# Patient Record
Sex: Female | Born: 1970 | Race: Black or African American | Hispanic: No | Marital: Married | Smoking: Never smoker
Health system: Southern US, Community
[De-identification: ages and names within clinical notes are randomized; demographics above are authoritative.]

## PROBLEM LIST (undated history)

## (undated) DIAGNOSIS — R87610 Atypical squamous cells of undetermined significance on cytologic smear of cervix (ASC-US): Secondary | ICD-10-CM

## (undated) HISTORY — DX: Atypical squamous cells of undetermined significance on cytologic smear of cervix (ASC-US): R87.610

---

## 2016-09-12 DIAGNOSIS — R87610 Atypical squamous cells of undetermined significance on cytologic smear of cervix (ASC-US): Secondary | ICD-10-CM

## 2016-09-12 HISTORY — DX: Atypical squamous cells of undetermined significance on cytologic smear of cervix (ASC-US): R87.610

## 2016-09-26 ENCOUNTER — Ambulatory Visit (INDEPENDENT_AMBULATORY_CARE_PROVIDER_SITE_OTHER): Payer: 59 | Admitting: Gynecology

## 2016-09-26 ENCOUNTER — Encounter: Payer: Self-pay | Admitting: Gynecology

## 2016-09-26 ENCOUNTER — Other Ambulatory Visit: Payer: Self-pay | Admitting: Gynecology

## 2016-09-26 VITALS — BP 122/78 | Ht 66.0 in | Wt 130.0 lb

## 2016-09-26 DIAGNOSIS — N926 Irregular menstruation, unspecified: Secondary | ICD-10-CM

## 2016-09-26 DIAGNOSIS — Z1231 Encounter for screening mammogram for malignant neoplasm of breast: Secondary | ICD-10-CM

## 2016-09-26 DIAGNOSIS — N852 Hypertrophy of uterus: Secondary | ICD-10-CM

## 2016-09-26 DIAGNOSIS — Z01419 Encounter for gynecological examination (general) (routine) without abnormal findings: Secondary | ICD-10-CM

## 2016-09-26 LAB — TSH: TSH: 1.78 m[IU]/L

## 2016-09-26 MED ORDER — MEGESTROL ACETATE 20 MG PO TABS: 20.0000 mg | ORAL_TABLET | Freq: Two times a day (BID) | ORAL | 1 refills | Status: DC

## 2016-09-26 NOTE — Progress Notes (Addendum)
Kaitlyn Escobar Aug 28, 1970 [id number]        46 y.o.  G0P0000 new patient for annual exam who presents to establish care .  Patient also complaining of a history of regular monthly menses until June where she bled straight for 30+ days stopped for 16 or so days and then started bleeding again now. Not having cramps or significant discomfort. No history of same before. Never told she had GYN issues in the past such as leiomyoma. Not sexually active for over a year. No significant weight changes skin or hair changes. No hot flushes sweats. No nipple discharge.   Past medical history,surgical history, problem list, medications, allergies, family history and social history were all reviewed and documented as reviewed in the EPIC chart.  ROS:  Performed with pertinent positives and negatives included in the history, assessment and plan.   Additional significant findings :  None   Exam: Caryn Bee assistant Vitals:   09/26/16 1413  BP: 122/78  Weight: 130 lb (59 kg)  Height: 5\' 6"  (1.676 m)   Body mass index is 20.98 kg/m.  General appearance:  Normal affect, orientation and appearance. Skin: Grossly normal HEENT: Without gross lesions.  No cervical or supraclavicular adenopathy. Thyroid normal.  Lungs:  Clear without wheezing, rales or rhonchi Cardiac: RR, without RMG Abdominal:  Soft, nontender, With pelvic mass to the umbilicus consistent with enlarged uterus  Breasts:  Examined lying and sitting without masses, retractions, discharge or axillary adenopathy. Pelvic:  Ext, BUS, Vagina: With menses flow  Cervix: With menses flow. Pap smear done  Uterus: Fills the pelvis to the level the umbilicus  Adnexa: Unable to evaluate   Anus and perineum: Normal   Rectovaginal: Normal sphincter tone without palpated masses or tenderness.    Assessment/Plan:  46 y.o. G0P0000 female for annual exam with irregular bleeding over the past 2 months. Not sexually active.   1. Large pelvic mass  consistent with leiomyoma. Patient reports normal exam 2 years ago but did not have an exam last year. Due to the irregular bleeding for the past 2 months will start with CBC and TSH FSH and hCG. We'll start with baseline ultrasound for better pelvic definition rule out ovarian process. Will start on Megace 20 mg twice a day slow bleeding and then once daily and to stay on this through the ultrasound. Patient not sexually active at this time in contraception is not an issue. I reviewed the differential with most likely diagnosis leiomyoma. Options for management were discussed. Childbearing is not an issue with her. Surgery such as myomectomy versus hysterectomy was discussed. Alternatives to include hormonal suppression although with such large size unlikely to be an feasible also uterine artery embolization options.  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 I discussed with her given the size of the uterus this would be an issue and we should try to optimize blood count operatively which may require prolonged menstrual suppression either with progesterone or Depo-Lupron. Possible iron infusion. Patient will follow up after her ultrasound. 2. Mammography never. Strongly recommended patient schedule a baseline mammogram and she agrees to arrange. 3. Pap smear 2016. Pap smear was done today after cleaning the cervix. No history of abnormal Pap smears previously.  4. Health maintenance. No routine lab work done as patient reports is done elsewhere. Follow up for lab results and ultrasound results.  Additional time in excess of her routine gynecologic exam was spent in direct face to face counseling and  coordination of care in regards to her irregular bleeding and leiomyoma.     Anastasio Auerbach MD, 2:26 PM 09/26/2016

## 2016-09-26 NOTE — Patient Instructions (Addendum)
Start on the Megace medication twice daily until bleeding gets lighter than once daily.  Follow up for ultrasound as scheduled.    Call to Schedule your mammogram  Facilities in [place]: 1)  [facility]. [address] [place] Phone: [phone] 2)  Dr. Isaiah Blakes at [facility] [address] Phone: [phone]     Mammogram A mammogram is an X-ray test to find changes in a woman's breast. You should get a mammogram if:  You are 27 years of age or older  You have risk factors.   Your doctor recommends that you have one.  BEFORE THE TEST  Do not schedule the test the week before your period, especially if your breasts are sore during this time.  On the day of your mammogram:  Wash your breasts and armpits well. After washing, do not put on any deodorant or talcum powder on until after your test.   Eat and drink as you usually do.   Take your medicines as usual.   If you are diabetic and take insulin, make sure you:   Eat before coming for your test.   Take your insulin as usual.   If you cannot keep your appointment, call before the appointment to cancel. Schedule another appointment.  TEST  You will need to undress from the waist up. You will put on a hospital gown.   Your breast will be put on the mammogram machine, and it will press firmly on your breast with a piece of plastic called a compression paddle. This will make your breast flatter so that the machine can X-ray all parts of your breast.   Both breasts will be X-rayed. Each breast will be X-rayed from above and from the side. An X-ray might need to be taken again if the picture is not good enough.   The mammogram will last about 15 to 30 minutes.  AFTER THE TEST Finding out the results of your test Ask when your test results will be ready. Make sure you get your test results.  Document Released: 04/27/2008 Document Revised:  01/18/2011 Document Reviewed: 04/27/2008 [facility] Patient Information 2012 [place].

## 2016-09-27 ENCOUNTER — Telehealth: Payer: Self-pay | Admitting: *Deleted

## 2016-09-27 ENCOUNTER — Inpatient Hospital Stay (HOSPITAL_COMMUNITY)
Admission: AD | Admit: 2016-09-27 | Discharge: 2016-09-27 | Disposition: A | Discharge status: Home / Self Care | Payer: 59 | Source: Ambulatory Visit | Attending: Obstetrics & Gynecology | Admitting: Obstetrics & Gynecology

## 2016-09-27 ENCOUNTER — Telehealth: Payer: Self-pay | Admitting: Gynecology

## 2016-09-27 DIAGNOSIS — N939 Abnormal uterine and vaginal bleeding, unspecified: Secondary | ICD-10-CM | POA: Insufficient documentation

## 2016-09-27 DIAGNOSIS — D259 Leiomyoma of uterus, unspecified: Secondary | ICD-10-CM | POA: Diagnosis not present

## 2016-09-27 LAB — CBC WITH DIFFERENTIAL/PLATELET
BASOS PCT: 1 %
Basophils Absolute: 96 cells/uL (ref 0–200)
EOS PCT: 1 %
Eosinophils Absolute: 96 cells/uL (ref 15–500)
HCT: 16.6 % — ABNORMAL LOW (ref 35.0–45.0)
HEMOGLOBIN: 4.4 g/dL — AB (ref 11.7–15.5)
LYMPHS ABS: 1536 {cells}/uL (ref 850–3900)
Lymphocytes Relative: 16 %
MCH: 18.3 pg — ABNORMAL LOW (ref 27.0–33.0)
MCHC: 26.5 g/dL — ABNORMAL LOW (ref 32.0–36.0)
MCV: 68.9 fL — ABNORMAL LOW (ref 80.0–100.0)
Monocytes Absolute: 288 cells/uL (ref 200–950)
Monocytes Relative: 3 %
NEUTROS ABS: 7584 {cells}/uL (ref 1500–7800)
Neutrophils Relative %: 79 %
Platelets: 247 10*3/uL (ref 140–400)
RBC: 2.41 MIL/uL — ABNORMAL LOW (ref 3.80–5.10)
RDW: 30.6 % — ABNORMAL HIGH (ref 11.0–15.0)
WBC: 9.6 10*3/uL (ref 3.8–10.8)

## 2016-09-27 LAB — FOLLICLE STIMULATING HORMONE: FSH: 10.6 m[IU]/mL

## 2016-09-27 LAB — HCG, SERUM, QUALITATIVE: Preg, Serum: NEGATIVE

## 2016-09-27 MED ORDER — SODIUM CHLORIDE 0.9 % IV SOLN
510.0000 mg | INTRAVENOUS | Status: DC
  Administered 2016-09-27: 510 mg via INTRAVENOUS
  Filled 2016-09-27: qty 17

## 2016-09-27 MED ORDER — LEUPROLIDE ACETATE 3.75 MG IM KIT
3.7500 mg | PACK | Freq: Once | INTRAMUSCULAR | Status: AC
  Administered 2016-09-27: 3.75 mg via INTRAMUSCULAR
  Filled 2016-09-27: qty 3.75

## 2016-09-27 MED ORDER — SODIUM CHLORIDE 0.9 % IV SOLN
INTRAVENOUS | Status: DC
  Administered 2016-09-27: 13:00:00 via INTRAVENOUS

## 2016-09-27 NOTE — Telephone Encounter (Signed)
I called the patient this morning when receiving her labs showed a hemoglobin of 4.4. Remainder of her labs were negative including negative pregnancy test. The patient reports feeling well without lightheadedness, although does note some fatigue.  She notes that her bleeding has decreased from yesterday having started Megace last night. She again confirmed that she would not accept blood transfusions as a [employer]. I reviewed the critical nature of her situation. At this point I asked her to keep herself well-hydrated, start on iron supplements orally twice daily, present to [facility] today as we are making arrangements for her to have a Feraheme infusion and a Depo-Lupron injection to hopefully suppress her menses. She will increase her Megace to 20 mg 3 times a day and remain on this for now. Lastly we will make ASAP arrangements for her to be seen at a [facility] for ongoing management as I feel that is the best place for her to be managed at this point.

## 2016-09-27 NOTE — MAU Note (Signed)
Pt states she was sent from office for IV Iron infusion.  States she's been tired.  Pt reports she "bled for 32 days straight"

## 2016-09-27 NOTE — Telephone Encounter (Signed)
Follow up regarding Dr.Fontaine telephone call with patient from today. Patient was told to go to [facility] hospital for Feraheme IV infusion also appointment has been made for pt at [place] on 10/01/16 @ 12:30pm with Dr.Erika Quentin Cornwall [address] [place] # [phone]    Left message for pt to call to relay appointment time and date.

## 2016-09-28 LAB — PAP IG W/ RFLX HPV ASCU

## 2016-09-28 NOTE — Telephone Encounter (Signed)
Pt informed with time and date of appointment.

## 2016-10-01 ENCOUNTER — Ambulatory Visit: Payer: Self-pay

## 2016-10-02 ENCOUNTER — Encounter: Payer: Self-pay | Admitting: Gynecology

## 2016-10-02 LAB — HUMAN PAPILLOMAVIRUS, HIGH RISK: HPV DNA High Risk: NOT DETECTED

## 2016-10-05 ENCOUNTER — Telehealth: Payer: Self-pay | Admitting: *Deleted

## 2016-10-05 NOTE — Telephone Encounter (Signed)
(  pt aware you are out of the office) Pt was told to take megace 20 mg 3 times a day for now, pt called to get refill because direction on Rx were take one po twice daily #30, pt will be out of medication. Having only spotting now, asked if you want her to continue Rx? If so new Rx is needed. Please advise

## 2016-10-07 NOTE — Telephone Encounter (Signed)
Ok to refill.  But patient needs to direct future questions to her MD's at [facility] who are now managing her care.

## 2016-10-08 ENCOUNTER — Encounter: Payer: Self-pay | Admitting: *Deleted

## 2016-10-08 MED ORDER — MEGESTROL ACETATE 20 MG PO TABS: 20.0000 mg | ORAL_TABLET | Freq: Two times a day (BID) | ORAL | 0 refills | Status: DC

## 2016-10-08 NOTE — Telephone Encounter (Signed)
It tried to call pt and relay but it appears the phone # has been changed or disconnected per message on phone. I will send pt a my chart message.

## 2016-10-09 ENCOUNTER — Ambulatory Visit
Admission: RE | Admit: 2016-10-09 | Discharge: 2016-10-09 | Disposition: A | Discharge status: Home / Self Care | Payer: 59 | Source: Ambulatory Visit | Attending: Gynecology | Admitting: Gynecology

## 2016-10-09 DIAGNOSIS — Z1231 Encounter for screening mammogram for malignant neoplasm of breast: Secondary | ICD-10-CM

## 2016-10-10 ENCOUNTER — Ambulatory Visit: Payer: 59 | Admitting: Gynecology

## 2016-10-10 ENCOUNTER — Other Ambulatory Visit: Payer: 59

## 2016-10-18 ENCOUNTER — Encounter: Payer: Self-pay | Admitting: Gynecology

## 2016-10-18 ENCOUNTER — Ambulatory Visit (INDEPENDENT_AMBULATORY_CARE_PROVIDER_SITE_OTHER): Payer: 59 | Admitting: Gynecology

## 2016-10-18 VITALS — BP 138/82

## 2016-10-18 DIAGNOSIS — D259 Leiomyoma of uterus, unspecified: Secondary | ICD-10-CM | POA: Diagnosis not present

## 2016-10-18 DIAGNOSIS — R102 Pelvic and perineal pain: Secondary | ICD-10-CM | POA: Diagnosis not present

## 2016-10-18 DIAGNOSIS — D500 Iron deficiency anemia secondary to blood loss (chronic): Secondary | ICD-10-CM

## 2016-10-18 MED ORDER — MEGESTROL ACETATE 20 MG PO TABS: 20.0000 mg | ORAL_TABLET | Freq: Four times a day (QID) | ORAL | 0 refills | Status: DC

## 2016-10-18 NOTE — Progress Notes (Signed)
    Kasyn Stouffer Jul 30, 1970 [id number]        46 y.o.  G0P0000 presents with her husband to discuss in general her upcoming appointment at [facility]. I initially saw the patient last month where she was found to have an enlarged uterus to the level of the umbilicus with heavy menses and intermenstrual bleeding.  Hemoglobin was 4.4. 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 She received IV iron, Depo-Lupron and was placed on Megace for menstrual suppression. She had an appointment with Dr. Granville Lewis at [facility] and has a follow up appointment next week to discuss treatment plan. She had an ultrasound there and an endometrial biopsy. By her history it sounds as if they discussed uterine artery embolization, myomectomy and hysterectomy. The going to further discuss her options this coming week. The patient just wanted my input.  Past medical history,surgical history, problem list, medications, allergies, family history and social history were all reviewed and documented in the EPIC chart.  Directed ROS with pertinent positives and negatives documented in the history of present illness/assessment and plan.  Exam: Vitals:   10/18/16 1609  BP: 138/82   General appearance:  Normal   Assessment/Plan:  46 y.o. G0P0000 with large leiomyoma, significant anemia and a history of heavy bleeding. Now is bleeding much less with daily spotting to light flow but not heavy with clots as before.  I reviewed in general the pros and cons of each choice of options from uterine artery embolization, myomectomy and hysterectomy as well as ongoing medical treatment with menstrual suppression. Given that she is symptomatic from discomfort from the size of the uterus it's unlikely that she will achieve relief of her discomfort through medical management for long-term but from a short-term hemoglobin recovery standpoint she appears to be doing well.  If she would consider uterine artery embolization then  she would need to see an interventional radiologist to determine if she is a candidate. From a surgical consideration future pregnancies are not an issue as her and her husband have decided not to pursue pregnancy and from that standpoint I would recommend a hysterectomy over myomectomy to decrease blood loss and decreased risk of regrowth of myomas in the future. Patient understands that [facility] is in charge of her care and that my input is informational only but that she will follow up with them for definitive treatment and management and again she has an appointment with them this coming week.  Greater than 50% of my time was spent in direct face to face counseling and coordination of care with the patient.    Anastasio Auerbach MD, 4:35 PM 10/18/2016

## 2016-10-18 NOTE — Patient Instructions (Signed)
I'll up with [facility] for your appointment as scheduled.

## 2016-10-25 ENCOUNTER — Encounter: Payer: Self-pay | Admitting: Gynecology

## 2016-11-15 ENCOUNTER — Other Ambulatory Visit: Payer: Self-pay | Admitting: Gynecology

## 2016-11-15 MED ORDER — MEGESTROL ACETATE 20 MG PO TABS: 20.0000 mg | ORAL_TABLET | Freq: Four times a day (QID) | ORAL | 1 refills | Status: AC

## 2016-11-15 NOTE — Telephone Encounter (Signed)
Okay to refill #30 with 1 refill

## 2016-11-15 NOTE — Telephone Encounter (Signed)
Rx sent 

## 2016-12-06 HISTORY — PX: ABDOMINAL HYSTERECTOMY: SHX81

## 2017-09-16 ENCOUNTER — Other Ambulatory Visit: Payer: Self-pay | Admitting: Gynecology

## 2017-09-16 DIAGNOSIS — Z1231 Encounter for screening mammogram for malignant neoplasm of breast: Secondary | ICD-10-CM

## 2017-10-11 ENCOUNTER — Ambulatory Visit
Admission: RE | Admit: 2017-10-11 | Discharge: 2017-10-11 | Disposition: A | Discharge status: Home / Self Care | Payer: 59 | Source: Ambulatory Visit | Attending: Gynecology | Admitting: Gynecology

## 2017-10-11 DIAGNOSIS — Z1231 Encounter for screening mammogram for malignant neoplasm of breast: Secondary | ICD-10-CM

## 2017-10-31 ENCOUNTER — Encounter: Payer: 59 | Admitting: Gynecology

## 2018-11-04 ENCOUNTER — Encounter: Payer: Self-pay | Admitting: Gynecology

## 2019-07-29 ENCOUNTER — Other Ambulatory Visit: Payer: Self-pay | Admitting: Nurse Practitioner

## 2019-07-29 DIAGNOSIS — Z1231 Encounter for screening mammogram for malignant neoplasm of breast: Secondary | ICD-10-CM

## 2020-07-28 ENCOUNTER — Ambulatory Visit (INDEPENDENT_AMBULATORY_CARE_PROVIDER_SITE_OTHER): Payer: Managed Care, Other (non HMO) | Admitting: Obstetrics and Gynecology

## 2020-07-28 ENCOUNTER — Encounter: Payer: Self-pay | Admitting: Obstetrics and Gynecology

## 2020-07-28 ENCOUNTER — Other Ambulatory Visit: Payer: Self-pay

## 2020-07-28 VITALS — BP 170/107 | HR 90 | Ht 66.0 in | Wt 169.0 lb

## 2020-07-28 DIAGNOSIS — Z1231 Encounter for screening mammogram for malignant neoplasm of breast: Secondary | ICD-10-CM

## 2020-07-28 DIAGNOSIS — Z01419 Encounter for gynecological examination (general) (routine) without abnormal findings: Secondary | ICD-10-CM | POA: Diagnosis not present

## 2020-07-28 NOTE — Progress Notes (Signed)
New Patient is in the office for annual. Hx of hysterectomy on 12-06-16 Last pap 09-26-2016 Last mammogram 10-01-19 at [facility] imaging Pt reports that she has "white coat syndrome" and BP is always elevated at appts but not at home.

## 2020-07-28 NOTE — Progress Notes (Signed)
   History:  [name] is a 50 y.o. G0P0000 who presents to clinic today for annual gyn exam.   Last mammogram in 2021 was abnormal and then she had follow up US. Ultimately showed benign cyst and dense breast tissue. She was instructed to have q6 month us/mammo but they told her she needs to have an annual exam before she can get this.  BP 170/107 - reports she takes her BP at home and its always normal. Has not had her BP cuff calibrated. Denies chest pain, headaches, vision changes, shortness of breath. Has a PCP appointment for physical in a few weeks.  Hx of hysterectomy in 2018 for heavy vaginal bleeding. Reports having multiple large fibroids. Denies current pelvic pain or dyspareunia.  Decline STD testing. Sexually active and monogamous w husband.   Endorses hot flashes and night sweats. No weight loss.  The following portions of the patient's history were reviewed and updated as appropriate: allergies, current medications, family history, past medical history, social history, past surgical history and problem list.  Review of Systems:  Review of Systems  Constitutional:  Negative for chills and fever.  Cardiovascular:  Negative for chest pain.  Gastrointestinal:  Negative for abdominal pain.  Skin:  Negative for rash.  Neurological:  Negative for dizziness and headaches.     Objective:  Physical Exam Ht 5\' 6"  (1.676 m)   Wt 169 lb (76.7 kg)   LMP 09/24/2016   BMI 27.28 kg/m  Physical Exam Vitals and nursing note reviewed.  Constitutional:      Appearance: Normal appearance.  Cardiovascular:     Rate and Rhythm: Normal rate.  Pulmonary:     Effort: Pulmonary effort is normal.  Chest:     Comments: Dense breast tissue. Symmetric, no masses or lesions  Abdominal:     General: Bowel sounds are normal.     Palpations: Abdomen is soft. There is no mass.     Comments: Large vertical abdominal scar   Skin:    General: Skin is warm and dry.  Neurological:      Mental Status: She is alert.  Psychiatric:        Mood and Affect: Mood normal.        Behavior: Behavior normal.      Labs and Imaging No results found for this or any previous visit (from the past 24 hour(s)).  No results found.   Assessment & Plan:  1. Encounter for annual routine gynecological examination -reviewed OB/GYN history. Pap is not indicated at this time.  -discussed high BP with patient and advised she bring her BP cuff with her to her appointment -offered to do screening labwork but she declines, plans to get this done at her annual with PCP -mammogram ordered.  -discussed perimenopausal symptoms and treatment options if become problematic. She can return prn if desires treatment.    Janet Berlin, MD 07/28/2020 2:38 PM    Sharene Skeans, MD OB Family Medicine Fellow, [facility] for [facility], [place]

## 2020-08-02 ENCOUNTER — Other Ambulatory Visit: Payer: Self-pay

## 2020-08-02 ENCOUNTER — Ambulatory Visit (HOSPITAL_BASED_OUTPATIENT_CLINIC_OR_DEPARTMENT_OTHER)
Admission: RE | Admit: 2020-08-02 | Discharge: 2020-08-02 | Disposition: A | Discharge status: Home / Self Care | Payer: Managed Care, Other (non HMO) | Source: Ambulatory Visit | Attending: Obstetrics and Gynecology | Admitting: Obstetrics and Gynecology

## 2020-08-02 DIAGNOSIS — Z1231 Encounter for screening mammogram for malignant neoplasm of breast: Secondary | ICD-10-CM | POA: Insufficient documentation

## 2023-01-14 IMAGING — MG MM DIGITAL SCREENING BILAT W/ TOMO AND CAD
8 series · 8 of 24 positions shown · non-contrast
Comparison: Previous exam(s).

CLINICAL DATA: Screening.

EXAM:
DIGITAL SCREENING BILATERAL MAMMOGRAM WITH TOMOSYNTHESIS AND CAD
TECHNIQUE: Bilateral screening digital craniocaudal and mediolateral oblique
mammograms were obtained. Bilateral screening digital breast
tomosynthesis was performed. The images were evaluated with
computer-aided detection.

[L MLO synth-2D]
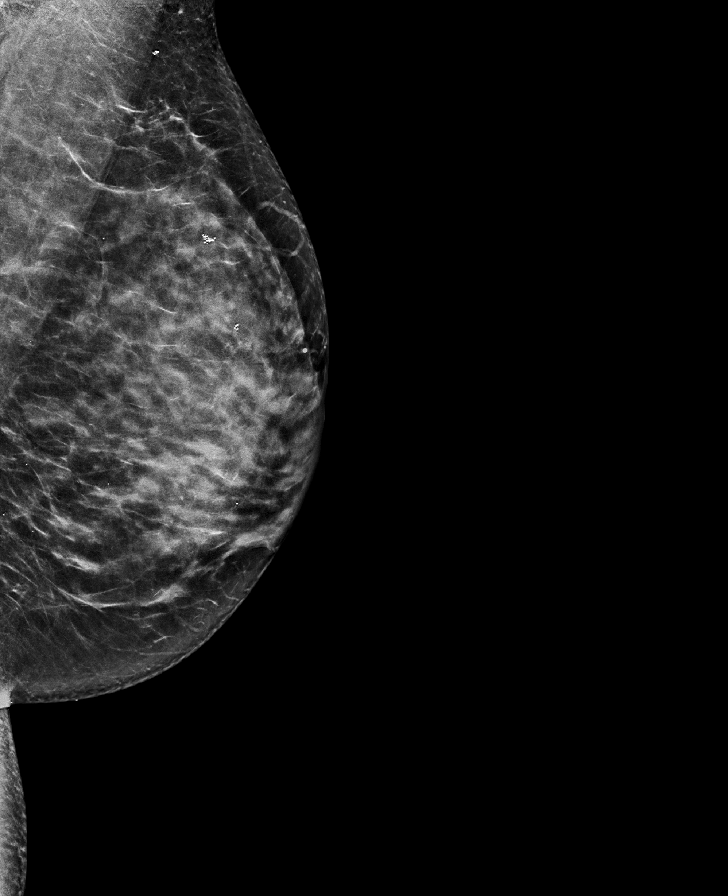

[R CC synth-2D]
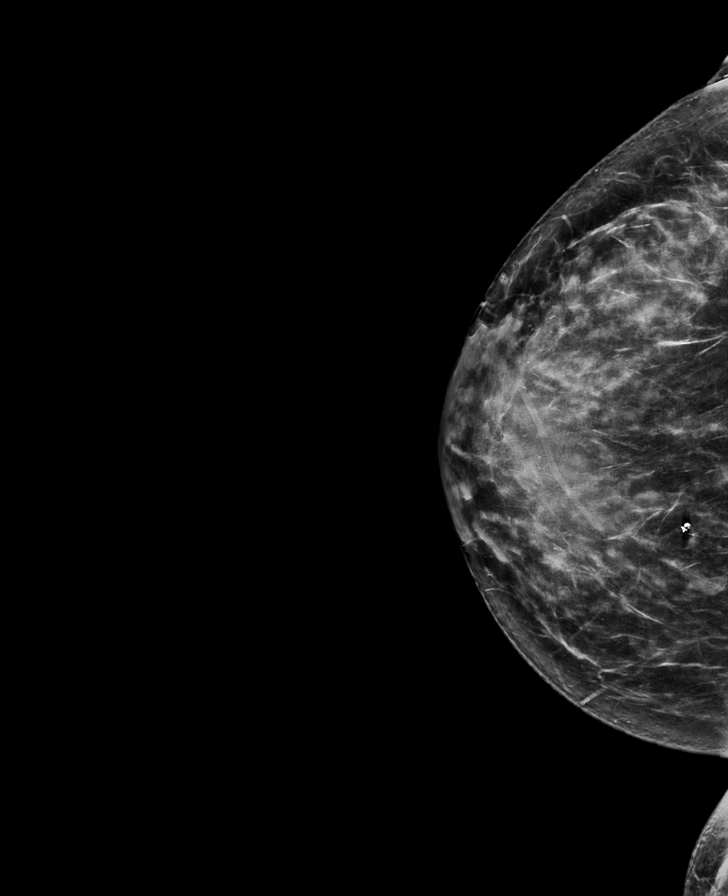

[L CC synth-2D]
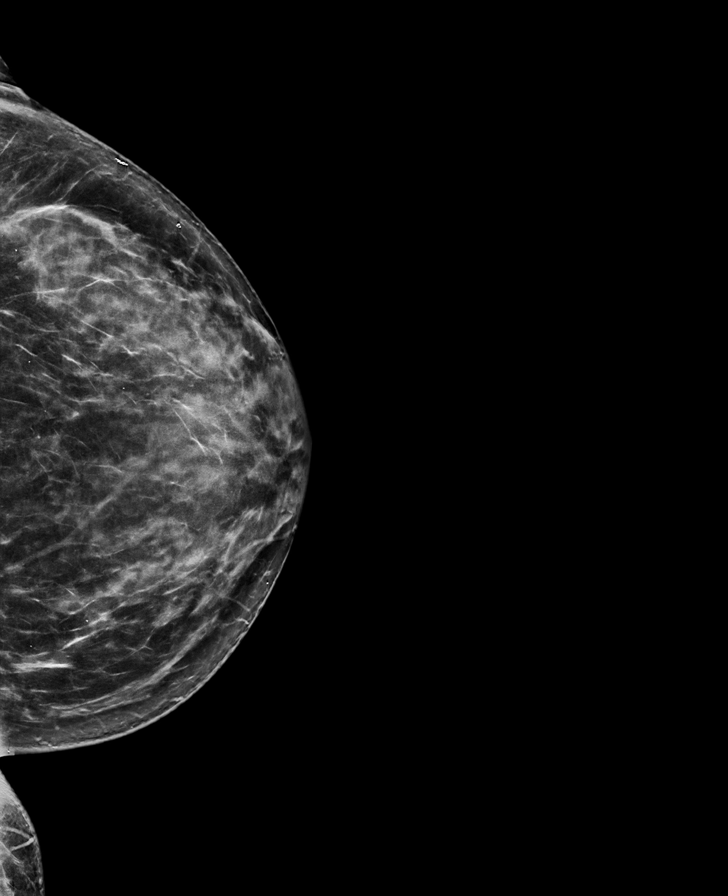

[R MLO synth-2D]
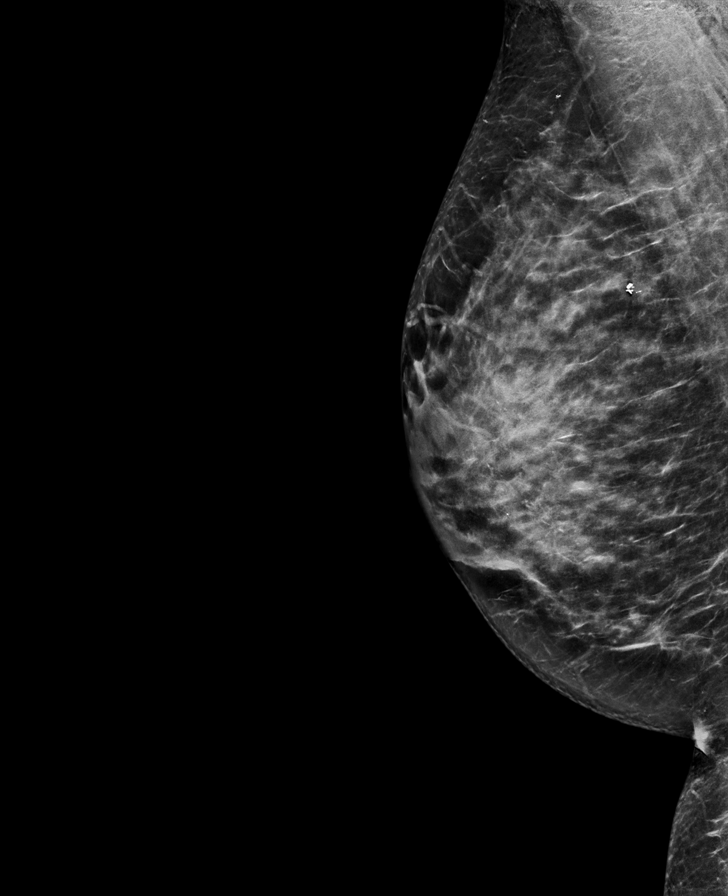

[R CC tomo · tomo slice 39/78.0]
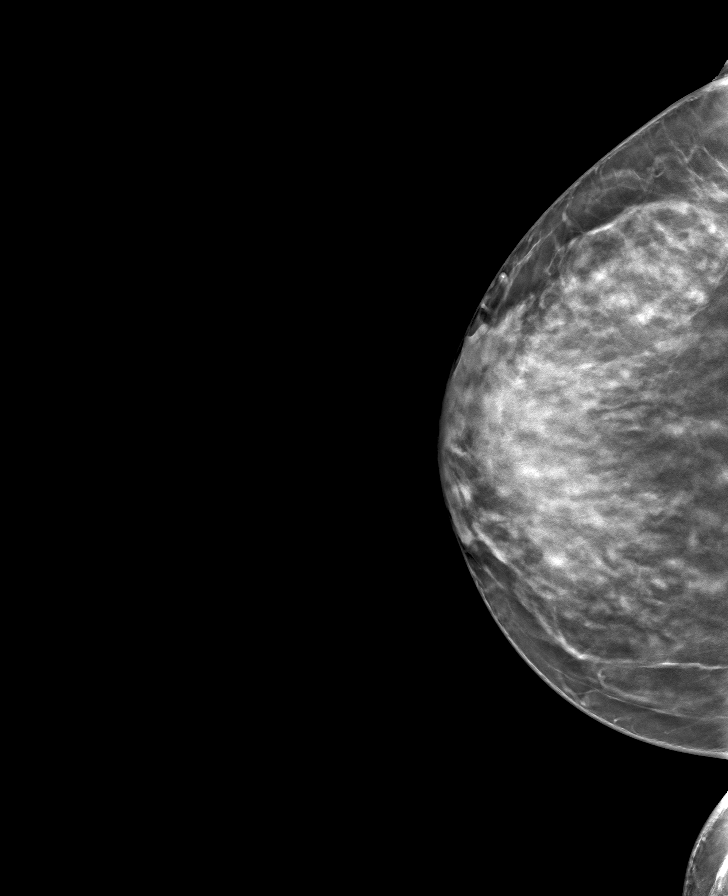

[L CC tomo · tomo slice 39/76.0]
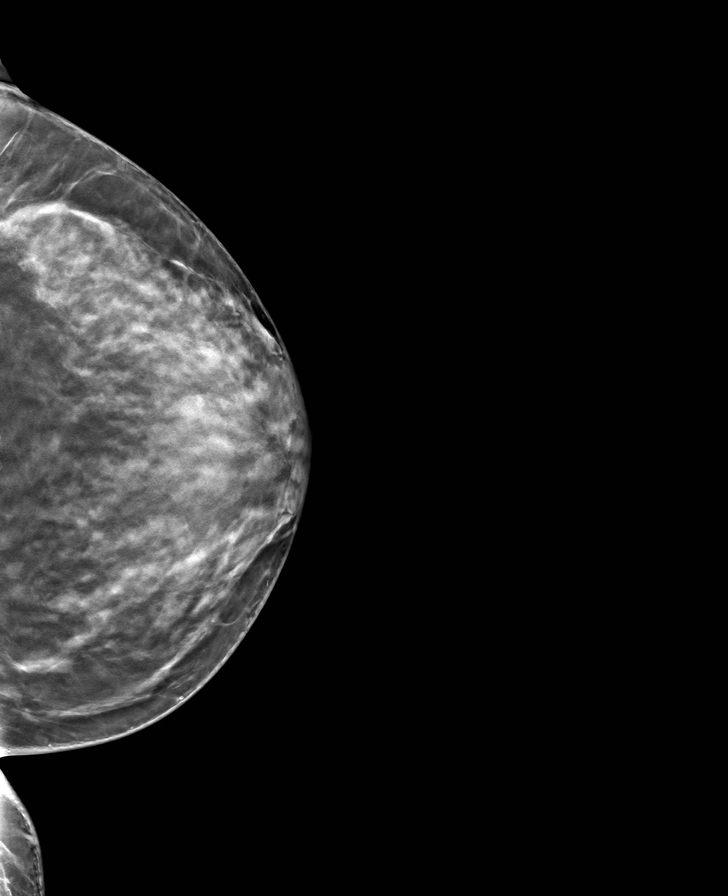

[L MLO tomo · tomo slice 37/74.0]
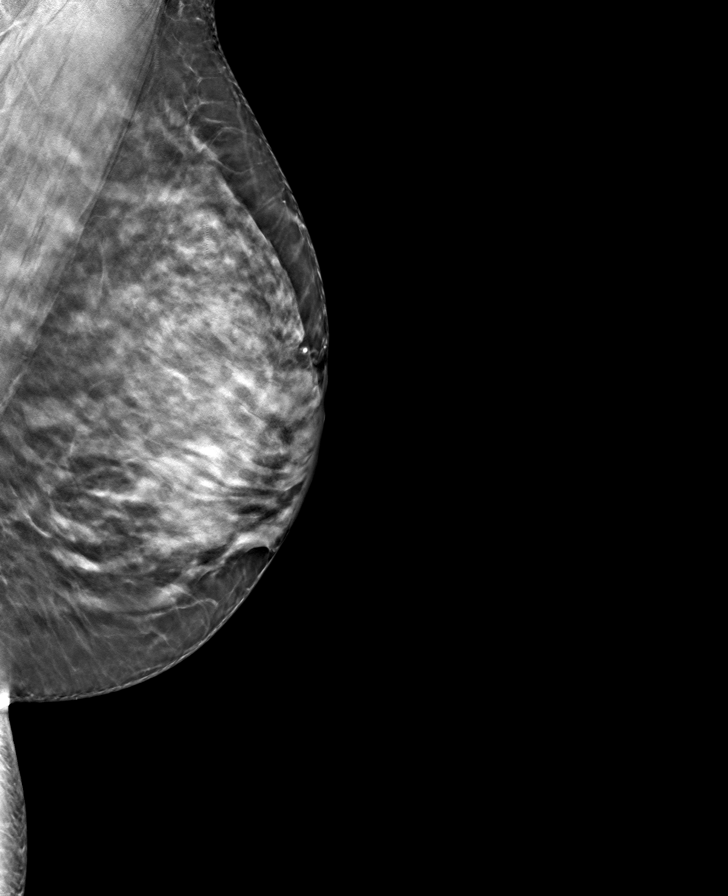

[R MLO tomo · tomo slice 37/74.0]
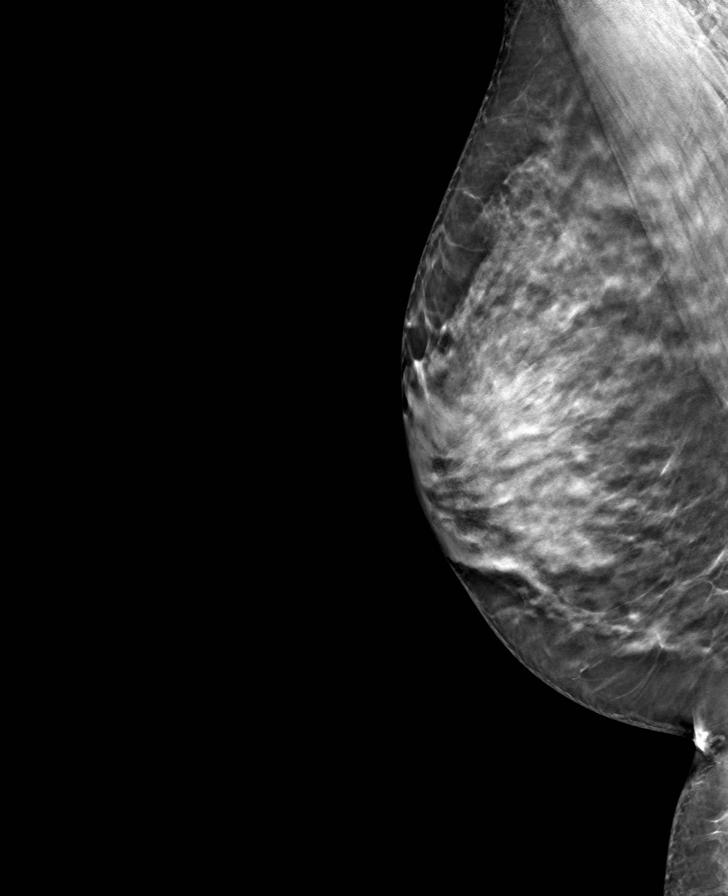

[8 of 24 positions shown; findings below may reference images not displayed]

ACR Breast Density Category d: The breast tissue is extremely dense,
which lowers the sensitivity of mammography
FINDINGS: There are no findings suspicious for malignancy.
IMPRESSION: No mammographic evidence of malignancy. A result letter of this
screening mammogram will be mailed directly to the patient.

RECOMMENDATION:
Screening mammogram in one year. (Code:TA-V-WV9)

BI-RADS CATEGORY  1: Negative.

## 2024-01-07 ENCOUNTER — Other Ambulatory Visit: Payer: Self-pay | Admitting: Orthopedic Surgery

## 2024-01-07 DIAGNOSIS — S83232A Complex tear of medial meniscus, current injury, left knee, initial encounter: Secondary | ICD-10-CM

## 2024-01-14 ENCOUNTER — Other Ambulatory Visit

## 2024-01-14 ENCOUNTER — Ambulatory Visit
Admission: RE | Admit: 2024-01-14 | Discharge: 2024-01-14 | Disposition: A | Source: Ambulatory Visit | Attending: Orthopedic Surgery | Admitting: Orthopedic Surgery

## 2024-01-14 DIAGNOSIS — S83232A Complex tear of medial meniscus, current injury, left knee, initial encounter: Secondary | ICD-10-CM | POA: Insufficient documentation
